# Patient Record
Sex: Female | Born: 1970 | Race: White | Hispanic: No | Smoking: Never smoker
Health system: Southern US, Community
[De-identification: ages and names within clinical notes are randomized; demographics above are authoritative.]

## PROBLEM LIST (undated history)

## (undated) DIAGNOSIS — D6851 Activated protein C resistance: Secondary | ICD-10-CM

## (undated) DIAGNOSIS — N63 Unspecified lump in unspecified breast: Secondary | ICD-10-CM

## (undated) DIAGNOSIS — E079 Disorder of thyroid, unspecified: Secondary | ICD-10-CM

## (undated) HISTORY — DX: Activated protein C resistance: D68.51

## (undated) HISTORY — PX: OTHER SURGICAL HISTORY: SHX169

## (undated) HISTORY — PX: BREAST BIOPSY: SHX20

## (undated) HISTORY — PX: BREAST EXCISIONAL BIOPSY: SUR124

## (undated) HISTORY — PX: TUBAL LIGATION: SHX77

## (undated) HISTORY — DX: Unspecified lump in unspecified breast: N63.0

---

## 1997-09-02 ENCOUNTER — Inpatient Hospital Stay (HOSPITAL_COMMUNITY): Admission: AD | Admit: 1997-09-02 | Discharge: 1997-09-02 | Payer: Self-pay | Admitting: Obstetrics & Gynecology

## 1998-01-09 ENCOUNTER — Inpatient Hospital Stay (HOSPITAL_COMMUNITY): Admission: AD | Admit: 1998-01-09 | Discharge: 1998-01-09 | Payer: Self-pay | Admitting: Obstetrics

## 1998-01-19 ENCOUNTER — Other Ambulatory Visit: Admission: RE | Admit: 1998-01-19 | Discharge: 1998-01-19 | Payer: Self-pay | Admitting: Obstetrics

## 1998-01-19 ENCOUNTER — Encounter: Admission: RE | Admit: 1998-01-19 | Discharge: 1998-01-19 | Payer: Self-pay | Admitting: Obstetrics

## 1998-03-29 ENCOUNTER — Ambulatory Visit (HOSPITAL_COMMUNITY): Admission: RE | Admit: 1998-03-29 | Discharge: 1998-03-29 | Payer: Self-pay | Admitting: *Deleted

## 1998-03-31 ENCOUNTER — Inpatient Hospital Stay (HOSPITAL_COMMUNITY): Admission: AD | Admit: 1998-03-31 | Discharge: 1998-03-31 | Payer: Self-pay | Admitting: Obstetrics and Gynecology

## 1998-04-07 ENCOUNTER — Ambulatory Visit (HOSPITAL_COMMUNITY): Admission: RE | Admit: 1998-04-07 | Discharge: 1998-04-07 | Payer: Self-pay | Admitting: *Deleted

## 1998-04-10 ENCOUNTER — Ambulatory Visit (HOSPITAL_COMMUNITY): Admission: RE | Admit: 1998-04-10 | Discharge: 1998-04-10 | Payer: Self-pay | Admitting: Obstetrics and Gynecology

## 1998-04-13 ENCOUNTER — Ambulatory Visit (HOSPITAL_COMMUNITY): Admission: RE | Admit: 1998-04-13 | Discharge: 1998-04-13 | Payer: Self-pay | Admitting: *Deleted

## 1998-04-17 ENCOUNTER — Ambulatory Visit (HOSPITAL_COMMUNITY): Admission: RE | Admit: 1998-04-17 | Discharge: 1998-04-17 | Payer: Self-pay | Admitting: Obstetrics and Gynecology

## 1998-04-20 ENCOUNTER — Ambulatory Visit (HOSPITAL_COMMUNITY): Admission: RE | Admit: 1998-04-20 | Discharge: 1998-04-20 | Payer: Self-pay | Admitting: Obstetrics & Gynecology

## 1998-04-27 ENCOUNTER — Ambulatory Visit (HOSPITAL_COMMUNITY): Admission: RE | Admit: 1998-04-27 | Discharge: 1998-04-27 | Payer: Self-pay | Admitting: Obstetrics and Gynecology

## 1998-05-22 ENCOUNTER — Ambulatory Visit (HOSPITAL_COMMUNITY): Admission: RE | Admit: 1998-05-22 | Discharge: 1998-05-22 | Payer: Self-pay | Admitting: *Deleted

## 1999-01-15 ENCOUNTER — Other Ambulatory Visit: Admission: RE | Admit: 1999-01-15 | Discharge: 1999-01-15 | Payer: Self-pay | Admitting: *Deleted

## 1999-12-20 ENCOUNTER — Other Ambulatory Visit: Admission: RE | Admit: 1999-12-20 | Discharge: 1999-12-20 | Payer: Self-pay | Admitting: Obstetrics & Gynecology

## 2000-01-04 ENCOUNTER — Ambulatory Visit: Admission: AD | Admit: 2000-01-04 | Discharge: 2000-01-04 | Payer: Self-pay | Admitting: Obstetrics and Gynecology

## 2001-02-02 ENCOUNTER — Other Ambulatory Visit: Admission: RE | Admit: 2001-02-02 | Discharge: 2001-02-02 | Payer: Self-pay | Admitting: Obstetrics and Gynecology

## 2001-07-27 ENCOUNTER — Inpatient Hospital Stay (HOSPITAL_COMMUNITY): Admission: AD | Admit: 2001-07-27 | Discharge: 2001-07-27 | Payer: Self-pay | Admitting: Obstetrics and Gynecology

## 2001-09-03 ENCOUNTER — Encounter: Payer: Self-pay | Admitting: Obstetrics and Gynecology

## 2001-09-03 ENCOUNTER — Encounter: Admission: RE | Admit: 2001-09-03 | Discharge: 2001-09-03 | Payer: Self-pay | Admitting: Obstetrics and Gynecology

## 2001-09-03 ENCOUNTER — Encounter: Admission: RE | Admit: 2001-09-03 | Discharge: 2001-09-03 | Payer: Self-pay | Admitting: Interventional Radiology

## 2001-10-06 ENCOUNTER — Inpatient Hospital Stay (HOSPITAL_COMMUNITY): Admission: AD | Admit: 2001-10-06 | Discharge: 2001-10-06 | Payer: Self-pay | Admitting: Obstetrics and Gynecology

## 2001-10-09 ENCOUNTER — Inpatient Hospital Stay (HOSPITAL_COMMUNITY): Admission: AD | Admit: 2001-10-09 | Discharge: 2001-10-09 | Payer: Self-pay | Admitting: Obstetrics and Gynecology

## 2001-10-10 ENCOUNTER — Encounter: Payer: Self-pay | Admitting: Obstetrics and Gynecology

## 2001-10-10 ENCOUNTER — Inpatient Hospital Stay (HOSPITAL_COMMUNITY): Admission: AD | Admit: 2001-10-10 | Discharge: 2001-10-24 | Payer: Self-pay | Admitting: Obstetrics and Gynecology

## 2001-10-12 ENCOUNTER — Encounter: Payer: Self-pay | Admitting: Obstetrics and Gynecology

## 2001-10-14 ENCOUNTER — Encounter: Payer: Self-pay | Admitting: Obstetrics and Gynecology

## 2001-10-25 ENCOUNTER — Encounter: Admission: RE | Admit: 2001-10-25 | Discharge: 2001-11-24 | Payer: Self-pay | Admitting: Obstetrics and Gynecology

## 2001-12-25 ENCOUNTER — Encounter: Admission: RE | Admit: 2001-12-25 | Discharge: 2002-01-24 | Payer: Self-pay | Admitting: Obstetrics and Gynecology

## 2004-07-26 ENCOUNTER — Ambulatory Visit: Payer: Self-pay

## 2005-12-27 ENCOUNTER — Emergency Department: Payer: Self-pay | Admitting: Emergency Medicine

## 2007-07-28 ENCOUNTER — Emergency Department: Payer: Self-pay | Admitting: Internal Medicine

## 2008-08-10 ENCOUNTER — Emergency Department: Payer: Self-pay | Admitting: Emergency Medicine

## 2009-03-17 ENCOUNTER — Ambulatory Visit: Payer: Self-pay | Admitting: General Surgery

## 2009-03-24 ENCOUNTER — Ambulatory Visit: Payer: Self-pay | Admitting: General Surgery

## 2009-06-02 ENCOUNTER — Ambulatory Visit: Payer: Self-pay | Admitting: General Surgery

## 2010-03-27 ENCOUNTER — Ambulatory Visit: Payer: Self-pay | Admitting: General Surgery

## 2010-04-20 ENCOUNTER — Ambulatory Visit: Payer: Self-pay | Admitting: General Surgery

## 2010-04-23 LAB — PATHOLOGY REPORT

## 2010-04-24 LAB — PATHOLOGY REPORT

## 2010-10-20 ENCOUNTER — Ambulatory Visit: Payer: Self-pay | Admitting: Family Medicine

## 2012-06-29 DIAGNOSIS — IMO0002 Reserved for concepts with insufficient information to code with codable children: Secondary | ICD-10-CM | POA: Insufficient documentation

## 2012-07-02 DIAGNOSIS — M51379 Other intervertebral disc degeneration, lumbosacral region without mention of lumbar back pain or lower extremity pain: Secondary | ICD-10-CM | POA: Insufficient documentation

## 2012-07-02 DIAGNOSIS — M5137 Other intervertebral disc degeneration, lumbosacral region: Secondary | ICD-10-CM | POA: Insufficient documentation

## 2012-12-30 DIAGNOSIS — K219 Gastro-esophageal reflux disease without esophagitis: Secondary | ICD-10-CM | POA: Insufficient documentation

## 2013-01-24 ENCOUNTER — Ambulatory Visit: Payer: Self-pay | Admitting: Family Medicine

## 2013-01-24 LAB — CBC WITH DIFFERENTIAL/PLATELET
Basophil #: 0 10*3/uL (ref 0.0–0.1)
Basophil %: 0.4 %
Eosinophil #: 0.1 10*3/uL (ref 0.0–0.7)
Eosinophil %: 1.9 %
HCT: 38.3 % (ref 35.0–47.0)
HGB: 13.3 g/dL (ref 12.0–16.0)
Lymphocyte #: 2.1 10*3/uL (ref 1.0–3.6)
Lymphocyte %: 28.7 %
MCH: 32 pg (ref 26.0–34.0)
MCHC: 34.7 g/dL (ref 32.0–36.0)
MCV: 92 fL (ref 80–100)
Monocyte #: 0.4 x10 3/mm (ref 0.2–0.9)
Monocyte %: 6.2 %
Neutrophil #: 4.5 10*3/uL (ref 1.4–6.5)
Neutrophil %: 62.8 %
Platelet: 226 10*3/uL (ref 150–440)
RBC: 4.15 10*6/uL (ref 3.80–5.20)
RDW: 13.8 % (ref 11.5–14.5)
WBC: 7.2 10*3/uL (ref 3.6–11.0)

## 2013-01-24 LAB — SEDIMENTATION RATE: Erythrocyte Sed Rate: 13 mm/hr (ref 0–20)

## 2013-08-12 DIAGNOSIS — M549 Dorsalgia, unspecified: Secondary | ICD-10-CM

## 2013-08-12 DIAGNOSIS — G8929 Other chronic pain: Secondary | ICD-10-CM | POA: Insufficient documentation

## 2013-11-03 ENCOUNTER — Encounter: Payer: Self-pay | Admitting: *Deleted

## 2013-11-15 ENCOUNTER — Ambulatory Visit: Payer: Self-pay | Admitting: General Surgery

## 2013-12-01 ENCOUNTER — Encounter: Payer: Self-pay | Admitting: *Deleted

## 2014-07-10 ENCOUNTER — Emergency Department: Payer: Self-pay | Admitting: Emergency Medicine

## 2014-07-10 LAB — COMPREHENSIVE METABOLIC PANEL
ALBUMIN: 4 g/dL (ref 3.4–5.0)
ALT: 29 U/L
Alkaline Phosphatase: 99 U/L
Anion Gap: 8 (ref 7–16)
BILIRUBIN TOTAL: 0.4 mg/dL (ref 0.2–1.0)
BUN: 8 mg/dL (ref 7–18)
CALCIUM: 9.2 mg/dL (ref 8.5–10.1)
CHLORIDE: 101 mmol/L (ref 98–107)
CO2: 30 mmol/L (ref 21–32)
Creatinine: 0.85 mg/dL (ref 0.60–1.30)
Glucose: 98 mg/dL (ref 65–99)
OSMOLALITY: 276 (ref 275–301)
Potassium: 3.2 mmol/L — ABNORMAL LOW (ref 3.5–5.1)
SGOT(AST): 19 U/L (ref 15–37)
SODIUM: 139 mmol/L (ref 136–145)
TOTAL PROTEIN: 7.6 g/dL (ref 6.4–8.2)

## 2014-07-10 LAB — CBC
HCT: 42.6 % (ref 35.0–47.0)
HGB: 14.5 g/dL (ref 12.0–16.0)
MCH: 32 pg (ref 26.0–34.0)
MCHC: 34.1 g/dL (ref 32.0–36.0)
MCV: 94 fL (ref 80–100)
Platelet: 320 10*3/uL (ref 150–440)
RBC: 4.53 10*6/uL (ref 3.80–5.20)
RDW: 13.8 % (ref 11.5–14.5)
WBC: 13.4 10*3/uL — ABNORMAL HIGH (ref 3.6–11.0)

## 2014-07-10 LAB — LIPASE, BLOOD: LIPASE: 117 U/L (ref 73–393)

## 2014-07-11 ENCOUNTER — Encounter: Payer: Self-pay | Admitting: General Surgery

## 2014-07-11 LAB — URINALYSIS, COMPLETE
Bilirubin,UR: NEGATIVE
Blood: NEGATIVE
Glucose,UR: NEGATIVE mg/dL (ref 0–75)
KETONE: NEGATIVE
Leukocyte Esterase: NEGATIVE
NITRITE: NEGATIVE
Ph: 7 (ref 4.5–8.0)
Protein: NEGATIVE
Specific Gravity: 1.005 (ref 1.003–1.030)
WBC UR: <1 /HPF (ref 0–5)

## 2014-07-27 ENCOUNTER — Ambulatory Visit: Payer: Self-pay | Admitting: General Surgery

## 2014-08-16 ENCOUNTER — Encounter: Payer: Self-pay | Admitting: *Deleted

## 2014-11-09 DIAGNOSIS — I10 Essential (primary) hypertension: Secondary | ICD-10-CM | POA: Insufficient documentation

## 2015-03-16 LAB — HM PAP SMEAR: HM Pap smear: NORMAL

## 2015-03-23 ENCOUNTER — Other Ambulatory Visit: Payer: Self-pay | Admitting: Unknown Physician Specialty

## 2015-03-23 DIAGNOSIS — N6459 Other signs and symptoms in breast: Secondary | ICD-10-CM

## 2015-03-29 ENCOUNTER — Ambulatory Visit
Admission: RE | Admit: 2015-03-29 | Discharge: 2015-03-29 | Disposition: A | Discharge status: Home / Self Care | Payer: 59 | Source: Ambulatory Visit | Attending: Unknown Physician Specialty | Admitting: Unknown Physician Specialty

## 2015-03-29 ENCOUNTER — Other Ambulatory Visit: Payer: Self-pay | Admitting: Unknown Physician Specialty

## 2015-03-29 DIAGNOSIS — R921 Mammographic calcification found on diagnostic imaging of breast: Secondary | ICD-10-CM | POA: Diagnosis present

## 2015-03-29 DIAGNOSIS — N6001 Solitary cyst of right breast: Secondary | ICD-10-CM | POA: Insufficient documentation

## 2015-03-29 DIAGNOSIS — N6459 Other signs and symptoms in breast: Secondary | ICD-10-CM

## 2015-04-18 DIAGNOSIS — E039 Hypothyroidism, unspecified: Secondary | ICD-10-CM | POA: Insufficient documentation

## 2015-12-14 HISTORY — PX: BACK SURGERY: SHX140

## 2016-11-28 ENCOUNTER — Ambulatory Visit (INDEPENDENT_AMBULATORY_CARE_PROVIDER_SITE_OTHER): Payer: BLUE CROSS/BLUE SHIELD | Admitting: Obstetrics and Gynecology

## 2016-11-28 ENCOUNTER — Encounter: Payer: Self-pay | Admitting: Obstetrics and Gynecology

## 2016-11-28 VITALS — BP 132/78 | HR 98 | Ht 67.0 in | Wt 251.0 lb

## 2016-11-28 DIAGNOSIS — N644 Mastodynia: Secondary | ICD-10-CM | POA: Diagnosis not present

## 2016-11-28 DIAGNOSIS — Z1239 Encounter for other screening for malignant neoplasm of breast: Secondary | ICD-10-CM

## 2016-11-28 DIAGNOSIS — Z1231 Encounter for screening mammogram for malignant neoplasm of breast: Secondary | ICD-10-CM

## 2016-11-28 DIAGNOSIS — N6001 Solitary cyst of right breast: Secondary | ICD-10-CM

## 2016-11-28 NOTE — Progress Notes (Signed)
Obstetrics & Gynecology Office Visit   Chief Complaint:  Chief Complaint  Patient presents with  . Breast Pain    severe right breast pain x 1 week    History of Present Illness: 46 year old with a history of bright breast cyst with normal diagnostic mammogram and ultrasound 03/30/15.  Noted worsening right sided breast pain unrelated to menstrual cycle. No recent trauma, excessive exercise to suggest MSK origin.  Has not noted any overlying skin changes.   Review of Systems: Review of Systems  Constitutional: Negative for chills, fever and weight loss.  Skin: Negative for rash.  Psychiatric/Behavioral: The patient is not nervous/anxious.     Past Medical History:  Past Medical History:  Diagnosis Date  . Breast lump     Past Surgical History:  Past Surgical History:  Procedure Laterality Date  . BACK SURGERY  12/2015   [facility]  . BREAST BIOPSY Right    negative  . BREAST EXCISIONAL BIOPSY Right    negative 2011  . CESAREAN SECTION  2003  . fatty lipoma    . TUBAL LIGATION      Gynecologic History: Patient's last menstrual period was 11/20/2016 (exact date).  Obstetric History: [id number]  Family History:  Family History  Problem Relation Age of Onset  . Breast cancer Neg Hx     Social History:  Social History   Social History  . Marital status: Divorced    Spouse name: N/A  . Number of children: N/A  . Years of education: N/A   Occupational History  . Not on file.   Social History Main Topics  . Smoking status: Never Smoker  . Smokeless tobacco: Never Used  . Alcohol use Yes  . Drug use: No  . Sexual activity: Yes    Birth control/ protection: Surgical   Other Topics Concern  . Not on file   Social History Narrative  . No narrative on file    Allergies:  Allergies  Allergen Reactions  . Sulfa Antibiotics Other (See Comments)    Medications: Prior to Admission medications   Medication Sig Start Date End Date Taking? Authorizing  Provider  acyclovir (ZOVIRAX) 200 MG capsule Take by mouth. 09/26/11  Yes [provider]  ALPRAZolam (NIRAVAM) 0.5 MG dissolvable tablet Take 0.5 mg by mouth. 01/05/14  Yes [provider]  esomeprazole (NEXIUM) 40 MG capsule Take 40 mg by mouth. 08/07/16  Yes [provider]  gabapentin (NEURONTIN) 300 MG capsule Take 300 mg by mouth. 07/24/16  Yes [provider]  levothyroxine (SYNTHROID, LEVOTHROID) 25 MCG tablet TAKE 1 TABLET ON AN EMPTY STOMACH WITH A GLASS OF WATER AT LEAST 30 TO 60 MINUTES BEFORE BREAKFAST. 02/16/16  Yes [provider]  sertraline (ZOLOFT) 100 MG tablet TAKE (1) TABLET BY MOUTH EVERY DAY 03/20/16  Yes [provider]  traMADol (ULTRAM) 50 MG tablet Take 50 mg by mouth. 07/24/16  Yes [provider]  cetirizine (ZYRTEC) 10 MG tablet Take 10 mg by mouth.    [provider]  cyclobenzaprine (FLEXERIL) 5 MG tablet  11/04/16   [provider]  hydrochlorothiazide (MICROZIDE) 12.5 MG capsule  09/27/16   [provider]  ibuprofen (ADVIL,MOTRIN) 800 MG tablet  11/04/16   [provider]    Physical Exam Vitals:  Vitals:   11/28/16 1504  BP: 132/78  Pulse: 98   Patient's last menstrual period was 11/20/2016 (exact date).  General: NAD HEENT: normocephalic, anicteric Breast: Symetrical, no  skin retraction, everted nipples, no discharge, no masses, reports tenderness on palpation of right breast upper outer quadrant.  No axillary or supraclavicular lymphadenopathy Pulmonary: No increased work of breathing Extremities: no edema, erythema, or tenderness Neurologic: Grossly intact Psychiatric: mood appropriate, affect full  Female chaperone present for pelvic and breast  portions of the physical exam  Assessment: 46 y.o. [id number] with mastalgia  Plan: Problem List Items Addressed This Visit    None    Visit Diagnoses    Pain of right breast    -  Primary   Relevant Orders   US  BREAST LTD UNI RIGHT INC AXILLA   Benign breast cyst in female, right       Relevant Orders   MM Digital Diagnostic Bilat   US BREAST LTD UNI RIGHT INC AXILLA   Breast screening       Relevant Orders   MM Digital Diagnostic Bilat     - diagnostic mammogram and ultrasound follow up right breast cyst - mammogram of left breast as well given due for screening - RTC in 8 weeks to re-evalute, annual, prior breast surgery by Jamal Collin

## 2016-11-28 NOTE — Patient Instructions (Signed)
Breast Cyst A breast cyst is a sac in the breast that is filled with fluid. Breast cysts are usually noncancerous (benign). They are common among women, and they are most often located in the upper, outer portion of the breast. One or more cysts may develop. They form when fluid builds up inside of the breast glands. There are several types of breast cysts:  Macrocyst. This is a cyst that is about 2 inches (5.1 cm) across (in diameter).  Microcyst. This is a very small cyst that you cannot feel, but it can be seen with imaging tests such as an X-ray of the breast (mammogram) or ultrasound.  Galactocele. This is a cyst that contains milk. It may develop if you suddenly stop breastfeeding.  Breast cysts do not increase your risk of breast cancer. They usually disappear after menopause, unless you take artificial hormones (are on hormone therapy). What are the causes? The exact cause of breast cysts is not known. Possible causes include:  Blockage of tubes (ducts) in the breast glands, which leads to fluid buildup. Duct blockage may result from: ? Fibrocystic breast changes. This is a common, benign condition that occurs when women go through hormonal changes during the menstrual cycle. This is a common cause of multiple breast cysts. ? Overgrowth of breast tissue or breast glands. ? Scar tissue in the breast from previous surgery.  Changes in certain female hormones (estrogen and progesterone).  What increases the risk? You may be more likely to develop breast cysts if you have not gone through menopause. What are the signs or symptoms? Symptoms of a breast cyst may include:  Feeling one or more smooth, round, soft lumps (like grapes) in the breast that are easily moveable. The lump(s) may get bigger and more painful before your period and get smaller after your period.  Breast discomfort or pain.  How is this diagnosed? A cyst can be felt during a physical exam by your health care  provider. A mammogram and ultrasound will be done to confirm the diagnosis. Fluid may be removed from the cyst with a needle (fine-needle aspiration) and tested to make sure the cyst is not cancerous. How is this treated? Treatment may not be necessary. Your health care provider may monitor the cyst to see if it goes away on its own. If the cyst is uncomfortable or gets bigger, or if you do not like how the cyst makes your breast look, you may need treatment. Treatment may include:  Hormone treatment.  Fine-needle aspiration, to drain fluid from the cyst. There is a chance of the cyst coming back (recurring) after aspiration.  Surgery to remove the cyst.  Follow these instructions at home:  See your health care provider regularly. ? Get a yearly physical exam. ? If you are 20-40 years old, get a clinical breast exam every 1-3 years. After age 40, get this exam every year. ? Get mammograms as often as directed.  Do a breast self-exam every month, or as often as directed. Having many breast cysts, or "lumpy" breasts, may make it harder to feel for new lumps. Understand how your breasts normally look and feel, and write down any changes in your breasts so you can tell your health care provider about the changes. A breast self-exam involves: ? Comparing your breasts in the mirror. ? Looking for visible changes in your skin or nipples. ? Feeling for lumps or changes.  Take over-the-counter and prescription medicines only as told by your health care   provider.  Wear a supportive bra, especially when exercising.  Follow instructions from your health care provider about eating and drinking restrictions. ? Avoid caffeine. ? Cut down on salt (sodium) in what you eat and drink, especially before your menstrual period. Too much sodium can cause fluid buildup (retention), breast swelling, and discomfort.  Keep all follow-up visits as told your health care provider. This is important. Contact a  health care provider if:  You feel, or think you feel, a lump in your breast.  You notice that both breasts look or feel different than usual.  Your breast is still causing pain after your menstrual period is over.  You find new lumps or bumps that were not there before.  You feel lumps in your armpit (axilla). Get help right away if:  You have severe pain, tenderness, redness, or warmth in your breast.  You have fluid or blood leaking from your nipple.  Your breast lump becomes hard and painful.  You notice dimpling or wrinkling of the breast or nipple. This information is not intended to replace advice given to you by your health care provider. Make sure you discuss any questions you have with your health care provider. Document Released: 07/01/2005 Document Revised: 03/22/2016 Document Reviewed: 03/22/2016 Elsevier Interactive Patient Education  2017 [employer]  

## 2016-11-29 NOTE — Addendum Note (Signed)
Addended by: Dorthula Nettles on: 11/29/2016 09:02 AM   Modules accepted: Orders

## 2016-11-29 NOTE — Addendum Note (Signed)
Addended by: Dorthula Nettles on: 11/29/2016 08:38 AM   Modules accepted: Orders

## 2016-12-06 ENCOUNTER — Ambulatory Visit: Payer: Self-pay

## 2016-12-06 ENCOUNTER — Other Ambulatory Visit: Payer: Self-pay

## 2016-12-10 ENCOUNTER — Other Ambulatory Visit: Payer: Self-pay

## 2016-12-10 ENCOUNTER — Ambulatory Visit: Payer: Self-pay

## 2017-09-26 DIAGNOSIS — R7303 Prediabetes: Secondary | ICD-10-CM | POA: Insufficient documentation

## 2018-05-04 DIAGNOSIS — R928 Other abnormal and inconclusive findings on diagnostic imaging of breast: Secondary | ICD-10-CM | POA: Insufficient documentation

## 2018-05-04 DIAGNOSIS — IMO0002 Reserved for concepts with insufficient information to code with codable children: Secondary | ICD-10-CM | POA: Insufficient documentation

## 2018-05-04 DIAGNOSIS — B009 Herpesviral infection, unspecified: Secondary | ICD-10-CM | POA: Insufficient documentation

## 2018-05-04 DIAGNOSIS — Z832 Family history of diseases of the blood and blood-forming organs and certain disorders involving the immune mechanism: Secondary | ICD-10-CM | POA: Insufficient documentation

## 2018-05-04 DIAGNOSIS — F419 Anxiety disorder, unspecified: Secondary | ICD-10-CM

## 2018-05-04 DIAGNOSIS — D249 Benign neoplasm of unspecified breast: Secondary | ICD-10-CM | POA: Insufficient documentation

## 2018-05-04 DIAGNOSIS — F329 Major depressive disorder, single episode, unspecified: Secondary | ICD-10-CM | POA: Insufficient documentation

## 2018-07-14 ENCOUNTER — Ambulatory Visit (INDEPENDENT_AMBULATORY_CARE_PROVIDER_SITE_OTHER): Payer: BLUE CROSS/BLUE SHIELD | Admitting: Maternal Newborn

## 2018-07-14 ENCOUNTER — Other Ambulatory Visit (HOSPITAL_COMMUNITY)
Admission: RE | Admit: 2018-07-14 | Discharge: 2018-07-14 | Disposition: A | Discharge status: Home / Self Care | Payer: BLUE CROSS/BLUE SHIELD | Source: Ambulatory Visit | Attending: Maternal Newborn | Admitting: Maternal Newborn

## 2018-07-14 ENCOUNTER — Encounter: Payer: Self-pay | Admitting: Maternal Newborn

## 2018-07-14 VITALS — BP 150/90 | Ht 67.0 in | Wt 256.0 lb

## 2018-07-14 DIAGNOSIS — B379 Candidiasis, unspecified: Secondary | ICD-10-CM

## 2018-07-14 DIAGNOSIS — N760 Acute vaginitis: Secondary | ICD-10-CM | POA: Insufficient documentation

## 2018-07-14 LAB — POCT WET PREP (WET MOUNT): Trichomonas Wet Prep HPF POC: ABSENT

## 2018-07-14 MED ORDER — FLUCONAZOLE 150 MG PO TABS: 150.0000 mg | ORAL_TABLET | Freq: Every day | ORAL | 0 refills | Status: AC

## 2018-07-14 NOTE — Progress Notes (Signed)
Obstetrics & Gynecology Office Visit   Chief Complaint:  Chief Complaint  Patient presents with  . Vaginitis   History of Present Illness: Karen Dudley has been having vaginal irritation and itching with discharge and was treated a couple of weeks ago for a yeast infection by her primary care provider. After taking Diflucan, her symptoms went away for a while, but they have now returned. Karen Dudley has had recurrent yeast infections in the past. She is not currently sexually active. She is wearing cotton scrubs to help prevent symptoms. She has tried taking a probiotic supplement in the past, but reported that this actually made her symptoms worse and she has stopped using it.   Review of Systems  Constitutional: Negative.   HENT: Positive for congestion.   Eyes: Negative.   Respiratory: Negative.   Cardiovascular: Negative.   Gastrointestinal: Negative.   Genitourinary: Positive for frequency.       Vaginal irritation, itching, and discharge  Musculoskeletal: Positive for joint pain.  Skin: Negative.   Neurological: Negative.   Endo/Heme/Allergies: Bruises/bleeds easily.       Hot flashes  Psychiatric/Behavioral: Positive for depression. The patient is nervous/anxious.   All other systems reviewed and are negative.   Past Medical History:  Past Medical History:  Diagnosis Date  . Breast lump     Past Surgical History:  Past Surgical History:  Procedure Laterality Date  . BACK SURGERY  12/2015   [facility]  . BREAST BIOPSY Right    negative  . BREAST EXCISIONAL BIOPSY Right    negative 2011  . CESAREAN SECTION  2003  . fatty lipoma    . TUBAL LIGATION      Gynecologic History: Patient's last menstrual period was 07/08/2018.  Obstetric History: [id number]  Family History:  Family History  Problem Relation Age of Onset  . Breast cancer Neg Hx     Social History:  Social History   Socioeconomic History  . Marital status: Divorced    Spouse name: Not on file  .  Number of children: Not on file  . Years of education: Not on file  . Highest education level: Not on file  Occupational History  . Not on file  Social Needs  . Financial resource strain: Not on file  . Food insecurity:    Worry: Not on file    Inability: Not on file  . Transportation needs:    Medical: Not on file    Non-medical: Not on file  Tobacco Use  . Smoking status: Never Smoker  . Smokeless tobacco: Never Used  Substance and Sexual Activity  . Alcohol use: Yes  . Drug use: No  . Sexual activity: Yes    Birth control/protection: Surgical  Lifestyle  . Physical activity:    Days per week: Not on file    Minutes per session: Not on file  . Stress: Not on file  Relationships  . Social connections:    Talks on phone: Not on file    Gets together: Not on file    Attends religious service: Not on file    Active member of club or organization: Not on file    Attends meetings of clubs or organizations: Not on file    Relationship status: Not on file  . Intimate partner violence:    Fear of current or ex partner: Not on file    Emotionally abused: Not on file    Physically abused: Not on file    Forced sexual activity:  Not on file  Other Topics Concern  . Not on file  Social History Narrative  . Not on file    Allergies:  Allergies  Allergen Reactions  . Sulfa Antibiotics Other (See Comments)    Medications: Prior to Admission medications   Medication Sig Start Date End Date Taking? Authorizing Provider  acyclovir (ZOVIRAX) 200 MG capsule Take by mouth. 09/26/11  Yes [provider]  ALPRAZolam (NIRAVAM) 0.5 MG dissolvable tablet Take 0.5 mg by mouth. 01/05/14  Yes [provider]  cetirizine (ZYRTEC) 10 MG tablet Take 10 mg by mouth.   Yes [provider]  cyclobenzaprine (FLEXERIL) 5 MG tablet  11/04/16  Yes [provider]  esomeprazole (NEXIUM) 40 MG capsule Take 40 mg by mouth. 08/07/16  Yes [provider]    gabapentin (NEURONTIN) 300 MG capsule Take 300 mg by mouth. 07/24/16  Yes [provider]  hydrochlorothiazide (MICROZIDE) 12.5 MG capsule  09/27/16  Yes [provider]  ibuprofen (ADVIL,MOTRIN) 800 MG tablet  11/04/16  Yes [provider]  levothyroxine (SYNTHROID, LEVOTHROID) 25 MCG tablet TAKE 1 TABLET ON AN EMPTY STOMACH WITH A GLASS OF WATER AT LEAST 30 TO 60 MINUTES BEFORE BREAKFAST. 02/16/16  Yes [provider]  sertraline (ZOLOFT) 100 MG tablet TAKE (1) TABLET BY MOUTH EVERY DAY 03/20/16  Yes [provider]  traMADol (ULTRAM) 50 MG tablet Take 50 mg by mouth. 07/24/16  Yes [provider]    Physical Exam Vitals:  Vitals:   07/14/18 0930  BP: (!) 150/90   Patient's last menstrual period was 07/08/2018.  General: NAD Pulmonary: No increased work of breathing Genitourinary:  External: mild erythema, otherwise normal external female  genitalia.  Normal urethral meatus, normal  Bartholin's and  Skene's glands.    Vagina: Normal vaginal mucosa, no evidence of prolapse.   Small amount of thick yellow discharge. Neurologic: Grossly intact Psychiatric: mood appropriate, affect full  Assessment: 47 y.o. [id number] with recurrent vaginitis with yeast on wet prep.  Plan: Problem List Items Addressed This Visit    None    Visit Diagnoses    Acute vaginitis    -  Primary   Relevant Orders   Cervicovaginal ancillary only     Yeast seen on wet prep. Swab sent to rule out concurrent BV due to recurrent nature of symptoms. Rx sent for Diflucan.  Avel Sensor, CNM 07/14/2018  9:54 AM

## 2018-07-16 LAB — CERVICOVAGINAL ANCILLARY ONLY: Bacterial vaginitis: NEGATIVE

## 2018-08-10 ENCOUNTER — Encounter: Payer: Self-pay | Admitting: Obstetrics and Gynecology

## 2018-08-10 ENCOUNTER — Ambulatory Visit (INDEPENDENT_AMBULATORY_CARE_PROVIDER_SITE_OTHER): Payer: BLUE CROSS/BLUE SHIELD | Admitting: Obstetrics and Gynecology

## 2018-08-10 ENCOUNTER — Other Ambulatory Visit (HOSPITAL_COMMUNITY)
Admission: RE | Admit: 2018-08-10 | Discharge: 2018-08-10 | Disposition: A | Discharge status: Home / Self Care | Payer: BLUE CROSS/BLUE SHIELD | Source: Ambulatory Visit | Attending: Obstetrics and Gynecology | Admitting: Obstetrics and Gynecology

## 2018-08-10 VITALS — BP 142/84 | Ht 67.0 in | Wt 262.0 lb

## 2018-08-10 DIAGNOSIS — A599 Trichomoniasis, unspecified: Secondary | ICD-10-CM | POA: Diagnosis not present

## 2018-08-10 DIAGNOSIS — Z01419 Encounter for gynecological examination (general) (routine) without abnormal findings: Secondary | ICD-10-CM | POA: Insufficient documentation

## 2018-08-10 DIAGNOSIS — Z124 Encounter for screening for malignant neoplasm of cervix: Secondary | ICD-10-CM | POA: Insufficient documentation

## 2018-08-10 DIAGNOSIS — Z113 Encounter for screening for infections with a predominantly sexual mode of transmission: Secondary | ICD-10-CM

## 2018-08-10 DIAGNOSIS — Z1339 Encounter for screening examination for other mental health and behavioral disorders: Secondary | ICD-10-CM

## 2018-08-10 DIAGNOSIS — Z1331 Encounter for screening for depression: Secondary | ICD-10-CM

## 2018-08-10 MED ORDER — METRONIDAZOLE 500 MG PO TABS: 2000.0000 mg | ORAL_TABLET | Freq: Once | ORAL | 0 refills | Status: AC

## 2018-08-10 NOTE — Progress Notes (Signed)
Gynecology Annual Exam  PCP: Patient, No Pcp Per  Chief Complaint  Patient presents with  . Annual Exam   History of Present Illness:  Ms. Karen Dudley is a 48 y.o. [id number] who LMP was Patient's last menstrual period was 08/02/2018., presents today for her annual examination.  Her menses are irregular. She will skip months sometimes.  Her menses last about 1-2 days and are a lot lighter. This has been going through this for about 2 months now.    She does have vasomotor symptoms. She has hot flashes, some weight gain.    She is not sexually active. She has a new boyfriend but is not sexually active .  Last Pap: 11/2016  Results were: no abnormalities /neg HPV DNA negative.  Hx of STDs: HSV  Last mammogram: 12/2016  Results were: BiRads 2 There is no FH of breast cancer. There is no FH of ovarian cancer. The patient does do self-breast exams.  Colonoscopy: has had with Dr. Jamal Collin.  She states that she is overdue.  DEXA: has not been screened for osteoporosis  Tobacco use: The patient denies current or previous tobacco use. Alcohol use: social drinker Exercise: not active. Has had back surgery and is trying to get back into it.  The patient wears seatbelts: yes.     She has had ongoing vaginal symptoms.  She has a discharge, itching, irritation.  She describes the discharge as white.  There is no odor to the discharge.  She has been treated for yeast and BV.     Past Medical History:  Diagnosis Date  . Breast lump   . Factor V Leiden carrier [facility])     Past Surgical History:  Procedure Laterality Date  . BACK SURGERY  12/2015   [facility]  . BREAST BIOPSY Right    negative  . BREAST EXCISIONAL BIOPSY Right    negative 2011  . CESAREAN SECTION  2003  . fatty lipoma    . TUBAL LIGATION      Prior to Admission medications   Medication Sig Start Date End Date Taking? Authorizing Provider  acyclovir (ZOVIRAX) 200 MG capsule Take by mouth. 09/26/11  Yes [provider]   ALPRAZolam (NIRAVAM) 0.5 MG dissolvable tablet Take 0.5 mg by mouth. 01/05/14  Yes [provider]  cetirizine (ZYRTEC) 10 MG tablet Take 10 mg by mouth.   Yes [provider]  cyclobenzaprine (FLEXERIL) 5 MG tablet  11/04/16  Yes [provider]  esomeprazole (NEXIUM) 40 MG capsule Take 40 mg by mouth. 08/07/16  Yes [provider]  gabapentin (NEURONTIN) 300 MG capsule Take 300 mg by mouth. 07/24/16  Yes [provider]  hydrochlorothiazide (MICROZIDE) 12.5 MG capsule  09/27/16  Yes [provider]  ibuprofen (ADVIL,MOTRIN) 800 MG tablet  11/04/16  Yes [provider]  levothyroxine (SYNTHROID, LEVOTHROID) 25 MCG tablet TAKE 1 TABLET ON AN EMPTY STOMACH WITH A GLASS OF WATER AT LEAST 30 TO 60 MINUTES BEFORE BREAKFAST. 02/16/16  Yes [provider]  sertraline (ZOLOFT) 100 MG tablet TAKE (1) TABLET BY MOUTH EVERY DAY 03/20/16  Yes [provider]  traMADol (ULTRAM) 50 MG tablet Take 50 mg by mouth. 07/24/16  Yes [provider]    Allergies  Allergen Reactions  . Sulfa Antibiotics Other (See Comments)   Obstetric History: [id number]  Family History  Problem Relation Age of Onset  . Breast cancer Neg Hx     Social History   Socioeconomic History  .  Marital status: Divorced    Spouse name: Not on file  . Number of children: Not on file  . Years of education: Not on file  . Highest education level: Not on file  Occupational History  . Not on file  Social Needs  . Financial resource strain: Not on file  . Food insecurity:    Worry: Not on file    Inability: Not on file  . Transportation needs:    Medical: Not on file    Non-medical: Not on file  Tobacco Use  . Smoking status: Never Smoker  . Smokeless tobacco: Never Used  Substance and Sexual Activity  . Alcohol use: Yes  . Drug use: No  . Sexual activity: Yes    Birth control/protection: Surgical  Lifestyle  . Physical activity:    Days per  week: Not on file    Minutes per session: Not on file  . Stress: Not on file  Relationships  . Social connections:    Talks on phone: Not on file    Gets together: Not on file    Attends religious service: Not on file    Active member of club or organization: Not on file    Attends meetings of clubs or organizations: Not on file    Relationship status: Not on file  . Intimate partner violence:    Fear of current or ex partner: Not on file    Emotionally abused: Not on file    Physically abused: Not on file    Forced sexual activity: Not on file  Other Topics Concern  . Not on file  Social History Narrative  . Not on file    Review of Systems  Constitutional: Negative.   HENT: Negative.   Eyes: Negative.   Respiratory: Negative.   Cardiovascular: Negative.   Gastrointestinal: Negative.   Genitourinary: Negative.   Musculoskeletal: Negative.   Skin: Negative.   Neurological: Negative.   Psychiatric/Behavioral: Negative.      Physical Exam BP (!) 142/84   Ht 5\' 7"  (1.702 m)   Wt 262 lb (118.8 kg)   LMP 08/02/2018   BMI 41.04 kg/m   Physical Exam Constitutional:      General: She is not in acute distress.    Appearance: She is well-developed.  Genitourinary:     Pelvic exam was performed with patient supine.     Uterus normal.     No inguinal adenopathy present in the right or left side.    No signs of injury in the vagina.     No vaginal discharge, erythema, tenderness or bleeding.     Cervical discharge (frothy, white) present.     No cervical motion tenderness, lesion or polyp.     Uterus is mobile.     Uterus is not enlarged or tender.     No uterine mass detected.    Uterus is anteverted.     No right or left adnexal mass present.     Right adnexa not tender or full.     Left adnexa not tender or full.  HENT:     Head: Normocephalic and atraumatic.  Eyes:     General: No scleral icterus. Neck:     Musculoskeletal: Normal range of motion and neck  supple.     Thyroid: No thyromegaly.  Cardiovascular:     Rate and Rhythm: Normal rate and regular rhythm.     Heart sounds: No murmur. No friction rub. No gallop.   Pulmonary:  Effort: Pulmonary effort is normal. No respiratory distress.     Breath sounds: Normal breath sounds. No wheezing or rales.  Chest:     Breasts:        Right: No inverted nipple, mass, nipple discharge, skin change or tenderness.        Left: No inverted nipple, mass, nipple discharge, skin change or tenderness.  Abdominal:     General: Bowel sounds are normal. There is no distension.     Palpations: Abdomen is soft. There is no mass.     Tenderness: There is no abdominal tenderness. There is no guarding or rebound.  Musculoskeletal: Normal range of motion.        General: No tenderness.  Lymphadenopathy:     Cervical: No cervical adenopathy.     Lower Body: No right inguinal adenopathy. No left inguinal adenopathy.  Neurological:     Mental Status: She is alert and oriented to person, place, and time.     Cranial Nerves: No cranial nerve deficit.  Skin:    General: Skin is warm and dry.     Findings: No erythema or rash.  Psychiatric:        Behavior: Behavior normal.        Judgment: Judgment normal.    Female chaperone present for pelvic and breast  portions of the physical exam  Results: AUDIT Questionnaire (screen for alcoholism): 1 PHQ-9: 1  Wet Prep: PH: 5.0 Clue Cells: Negative Fungal elements: Negative Trichomonas: Positive   Assessment: 48 y.o. [id number] female here for routine gynecologic examination.  Plan: Problem List Items Addressed This Visit    None    Visit Diagnoses    Women's annual routine gynecological examination    -  Primary   Relevant Orders   Cytology - PAP   POCT Wet Prep [facility]) (Completed)   Screening for alcoholism       Screening for depression       Pap smear for cervical cancer screening       Relevant Orders   Cytology - PAP   Trichomoniasis        Screen for STD (sexually transmitted disease)       Relevant Orders   Cytology - PAP   POCT Wet Prep [facility]) (Completed)      Screening: -- Blood pressure screen managed by PCP -- Colonoscopy - not due -- Mammogram - due. Patient to call [place] to arrange. She understands that it is her responsibility to arrange this. -- Weight screening: obese: discussed management options, including lifestyle, dietary, and exercise. -- Depression screening negative (PHQ-9) -- Nutrition: normal -- cholesterol screening: per PCP -- osteoporosis screening: not due -- tobacco screening: not using -- alcohol screening: AUDIT questionnaire indicates low-risk usage. -- family history of breast cancer screening: done. not at high risk. -- no evidence of domestic violence or intimate partner violence. -- STD screening: gonorrhea/chlamydia NAAT collected -- pap smear collected per ASCCP guidelines -- flu vaccine received -- HPV vaccination series: not eligilbe   Trichomoniasis: Treat with Flagyl 2 grams x 1 dose.  Will get other STD testing (gonorrhea, chlamydia, trichomonas for verification). Discussed that partner needs to be treated and any of his partners.  Avoid intercourse x 7 days with any new partners.    Prentice Docker, MD 08/11/2018 1:45 PM

## 2018-08-11 ENCOUNTER — Encounter: Payer: Self-pay | Admitting: Obstetrics and Gynecology

## 2018-08-11 LAB — POCT WET PREP (WET MOUNT): PH, VAGINAL: 5

## 2018-08-12 ENCOUNTER — Encounter: Payer: Self-pay | Admitting: Obstetrics and Gynecology

## 2018-08-12 LAB — CYTOLOGY - PAP
ADEQUACY: ABSENT
CHLAMYDIA, DNA PROBE: NEGATIVE
Diagnosis: NEGATIVE
HPV (WINDOPATH): NOT DETECTED
Neisseria Gonorrhea: NEGATIVE
Trichomonas: POSITIVE — AB

## 2019-06-28 ENCOUNTER — Ambulatory Visit (INDEPENDENT_AMBULATORY_CARE_PROVIDER_SITE_OTHER): Payer: BLUE CROSS/BLUE SHIELD

## 2019-06-28 ENCOUNTER — Other Ambulatory Visit: Payer: Self-pay

## 2019-06-28 ENCOUNTER — Encounter: Payer: Self-pay | Admitting: Emergency Medicine

## 2019-06-28 ENCOUNTER — Ambulatory Visit
Admission: EM | Admit: 2019-06-28 | Discharge: 2019-06-28 | Disposition: A | Discharge status: Home / Self Care | Payer: BLUE CROSS/BLUE SHIELD | Attending: Family Medicine | Admitting: Family Medicine

## 2019-06-28 DIAGNOSIS — S90121A Contusion of right lesser toe(s) without damage to nail, initial encounter: Secondary | ICD-10-CM | POA: Diagnosis not present

## 2019-06-28 DIAGNOSIS — L02611 Cutaneous abscess of right foot: Secondary | ICD-10-CM

## 2019-06-28 DIAGNOSIS — L03031 Cellulitis of right toe: Secondary | ICD-10-CM | POA: Diagnosis not present

## 2019-06-28 DIAGNOSIS — M79674 Pain in right toe(s): Secondary | ICD-10-CM | POA: Diagnosis not present

## 2019-06-28 HISTORY — DX: Disorder of thyroid, unspecified: E07.9

## 2019-06-28 MED ORDER — CEPHALEXIN 500 MG PO CAPS: 500.0000 mg | ORAL_CAPSULE | Freq: Three times a day (TID) | ORAL | 0 refills | Status: AC

## 2019-06-28 MED ORDER — FLUCONAZOLE 150 MG PO TABS: 150.0000 mg | ORAL_TABLET | Freq: Every day | ORAL | 0 refills | Status: AC

## 2019-06-28 NOTE — ED Provider Notes (Signed)
[facility]    CSN: [id number] Arrival date & time: 06/28/19  1441      History   Chief Complaint Chief Complaint  Patient presents with  . Toe Injury    DOI 06/22/19    HPI Karen Dudley is a 48 y.o. female.   48 yo female with a c/o right little toe pain after stubbing her toe about 5 days ago. Also c/o a blister between the 4th and 5th toes and redness around that area and top of the foot. Denies any drainage, fevers, chills.      Past Medical History:  Diagnosis Date  . Breast lump   . Factor V Leiden carrier ([place])   . Thyroid disease     Patient Active Problem List   Diagnosis Date Noted  . Abnormal mammogram 05/04/2018  . Abnormal Pap smear 05/04/2018  . Anxiety and depression 05/04/2018  . Family history of factor V Leiden mutation 05/04/2018  . Fibroadenoma 05/04/2018  . 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 Prediabetes 09/26/2017  . Hypothyroidism 04/18/2015  . Essential hypertension 11/09/2014  . Back pain, chronic 08/12/2013  . Class 2 severe obesity due to excess calories with serious comorbidity and body mass index (BMI) of 36.0 to 36.9 in adult ([place]) 12/30/2012  . Gastroesophageal reflux disease without esophagitis 12/30/2012  . DDD (degenerative disc disease), lumbosacral 07/02/2012  . Thoracic or lumbosacral neuritis or radiculitis 06/29/2012    Past Surgical History:  Procedure Laterality Date  . BACK SURGERY  12/2015   [facility]  . BREAST BIOPSY Right    negative  . BREAST EXCISIONAL BIOPSY Right    negative 2011  . CESAREAN SECTION  2003  . fatty lipoma    . TUBAL LIGATION      OB History    Gravida  5   Para  2   Term      Preterm  1   AB  3   Living  1     SAB  2   TAB      Ectopic  1   Multiple      Live Births               Home Medications    Prior to Admission medications   Medication Sig Start Date End Date Taking? Authorizing Provider  acyclovir (ZOVIRAX) 200 MG  capsule Take by mouth. 09/26/11  Yes [provider]  cetirizine (ZYRTEC) 10 MG tablet Take 10 mg by mouth.   Yes [provider]  cyclobenzaprine (FLEXERIL) 5 MG tablet  11/04/16  Yes [provider]  esomeprazole (NEXIUM) 40 MG capsule Take 40 mg by mouth. 08/07/16  Yes [provider]  gabapentin (NEURONTIN) 300 MG capsule Take 300 mg by mouth. 07/24/16  Yes [provider]  hydrochlorothiazide (MICROZIDE) 12.5 MG capsule  09/27/16  Yes [provider]  ibuprofen (ADVIL,MOTRIN) 800 MG tablet  11/04/16  Yes [provider]  levothyroxine (SYNTHROID, LEVOTHROID) 25 MCG tablet TAKE 1 TABLET ON AN EMPTY STOMACH WITH A GLASS OF WATER AT LEAST 30 TO 60 MINUTES BEFORE BREAKFAST. 02/16/16  Yes [provider]  sertraline (ZOLOFT) 100 MG tablet TAKE (1) TABLET BY MOUTH EVERY DAY 03/20/16  Yes [provider]  traMADol (ULTRAM) 50 MG tablet Take 50 mg by mouth. 07/24/16  Yes [provider]  ALPRAZolam (NIRAVAM) 0.5 MG dissolvable tablet Take 0.5 mg by mouth. 01/05/14   [provider]  cephALEXin (KEFLEX)  500 MG capsule Take 1 capsule (500 mg total) by mouth 3 (three) times daily. 06/28/19   Norval Gable, MD  fluconazole (DIFLUCAN) 150 MG tablet Take 1 tablet (150 mg total) by mouth daily. 06/28/19   Norval Gable, MD    Family History Family History  Problem Relation Age of Onset  . Depression Mother   . Alcohol abuse Father   . Breast cancer Neg Hx     Social History Social History   Tobacco Use  . Smoking status: Never Smoker  . Smokeless tobacco: Never Used  Substance Use Topics  . Alcohol use: Yes    Comment: rare  . Drug use: No     Allergies   Sulfa antibiotics   Review of Systems Review of Systems   Physical Exam Triage Vital Signs ED Triage Vitals  Enc Vitals Group     BP 06/28/19 1510 126/70     Pulse Rate 06/28/19 1510 100     Resp 06/28/19 1510 18     Temp 06/28/19 1510  98.4 F (36.9 C)     Temp Source 06/28/19 1510 Oral     SpO2 06/28/19 1510 99 %     Weight 06/28/19 1510 250 lb (113.4 kg)     Height 06/28/19 1510 5\' 7"  (1.702 m)     Head Circumference --      Peak Flow --      Pain Score 06/28/19 1509 9     Pain Loc --      Pain Edu? --      Excl. in [place]? --    No data found.  Updated Vital Signs BP 126/70 (BP Location: Left Arm)   Pulse 100   Temp 98.4 F (36.9 C) (Oral)   Resp 18   Ht 5\' 7"  (1.702 m)   Wt 113.4 kg   LMP 06/14/2019 (Approximate)   SpO2 99%   BMI 39.16 kg/m   Visual Acuity Right Eye Distance:   Left Eye Distance:   Bilateral Distance:    Right Eye Near:   Left Eye Near:    Bilateral Near:     Physical Exam Vitals and nursing note reviewed.  Constitutional:      General: She is not in acute distress.    Appearance: She is not toxic-appearing or diaphoretic.  Musculoskeletal:     Right foot: Normal range of motion and normal capillary refill. Bony tenderness (5th toe) present. No bunion, Charcot foot, foot drop, prominent metatarsal heads, laceration or crepitus. Normal pulse.     Comments: Small blister between the 4th and 5th toes with mild surrounding skin erythema, warmth and tenderness  Neurological:     Mental Status: She is alert.      UC Treatments / Results  Labs (all labs ordered are listed, but only abnormal results are displayed) Labs Reviewed - No data to display  EKG   Radiology No results found.  Procedures Procedures (including critical care time)  Medications Ordered in UC Medications - No data to display  Initial Impression / Assessment and Plan / UC Course  I have reviewed the triage vital signs and the nursing notes.  Pertinent labs & imaging results that were available during my care of the patient were reviewed by me and considered in my medical decision making (see chart for details).      Final Clinical Impressions(s) / UC Diagnoses   Final diagnoses:  Abscess or  cellulitis of toe, right  Contusion of fifth toe of  right foot, initial encounter     Discharge Instructions     Warm water soaks or compresses; elevate; tylenol/advil as needed    ED Prescriptions    Medication Sig Dispense Auth. Provider   cephALEXin (KEFLEX) 500 MG capsule Take 1 capsule (500 mg total) by mouth 3 (three) times daily. 30 capsule Norval Gable, MD   fluconazole (DIFLUCAN) 150 MG tablet Take 1 tablet (150 mg total) by mouth daily. 1 tablet Norval Gable, MD      1. x-ray results and diagnosis reviewed with patient 2. rx as per orders above; reviewed possible side effects, interactions, risks and benefits  3. Recommend supportive treatment as above 4. Follow-up prn if symptoms worsen or don't improve  PDMP not reviewed this encounter.   Norval Gable, MD 06/30/19 [phone]

## 2019-06-28 NOTE — Discharge Instructions (Signed)
Warm water soaks or compresses; elevate; tylenol/advil as needed

## 2019-06-28 NOTE — ED Triage Notes (Signed)
Patient in today c/o right little toe after hitting her toe on a metal gate on 06/22/19.

## 2020-04-23 IMAGING — CR DG TOE 5TH 2+V*R*
4 series · 4 of 4 positions shown · non-contrast
Comparison: None.

CLINICAL DATA: Right fifth toe pain after hitting it on a metal
gate on 06/22/2019.

EXAM:
RIGHT FIFTH TOE

[toe ap (1 of 2)]
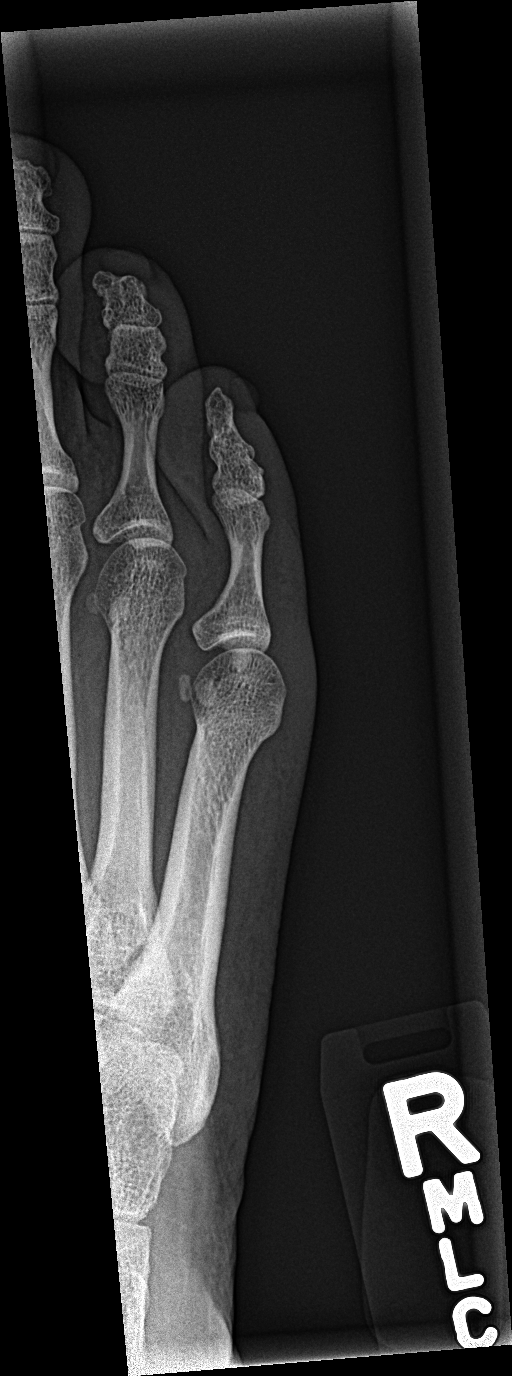

[toe obl]
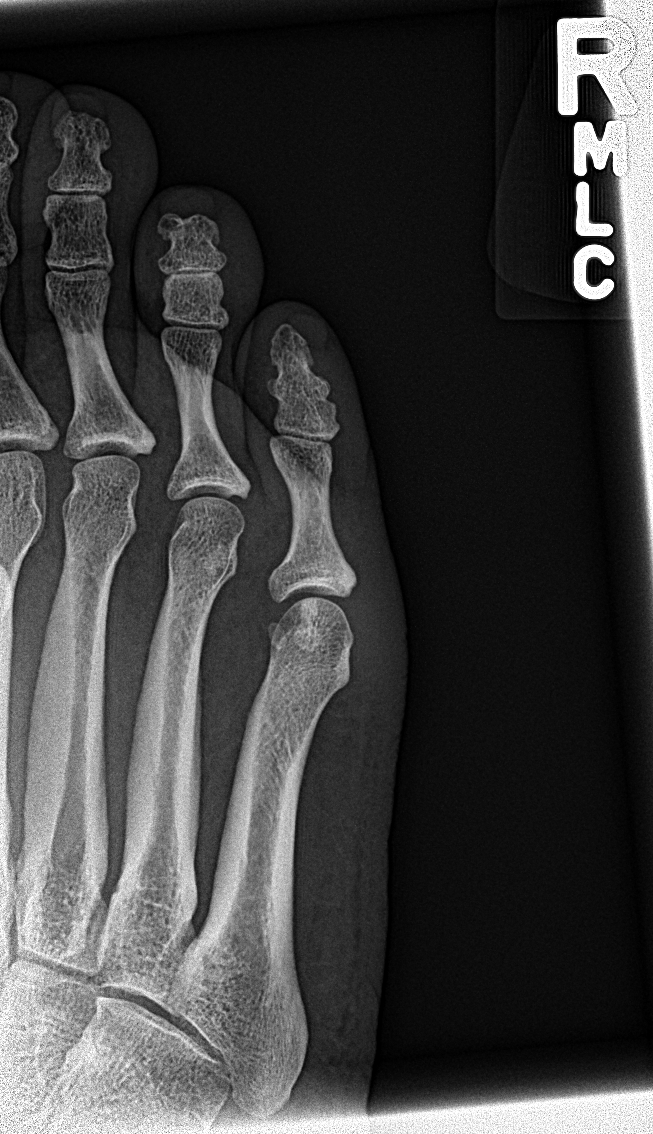

[toe lat]
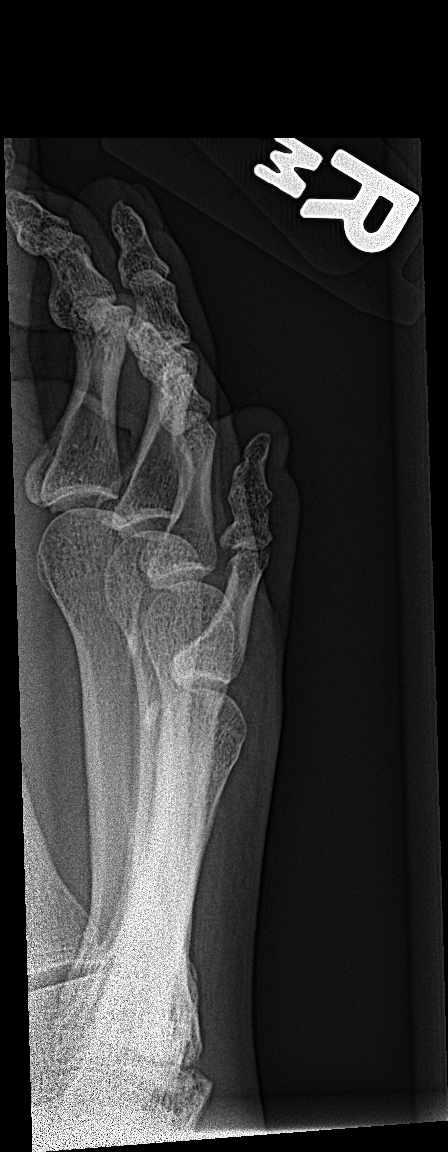

[toe ap (2 of 2)]
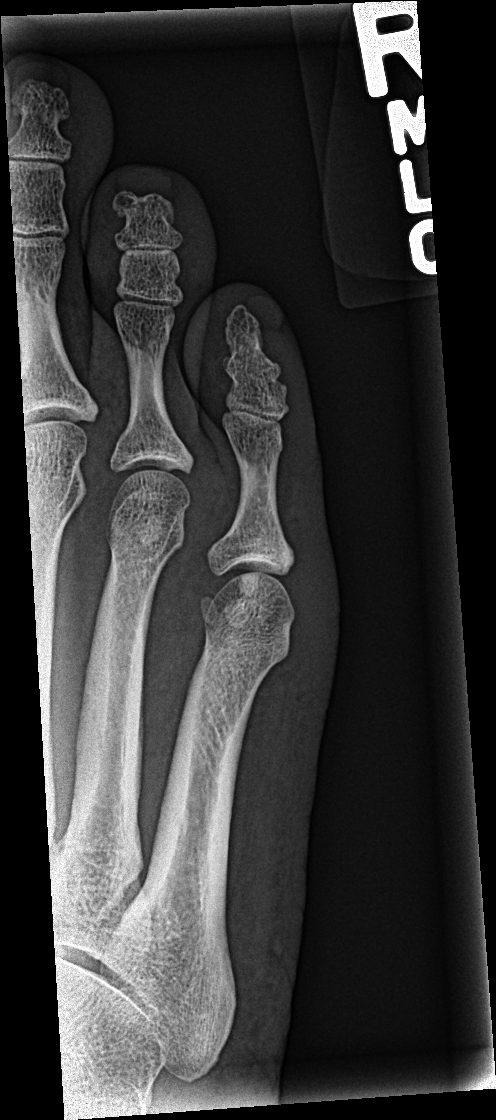

[4 of 4 positions shown; findings below may reference images not displayed]

FINDINGS: No acute fracture or dislocation is identified. Fusion of the middle
and distal phalanges of the fifth toe is likely congenital. There is
mild soft tissue swelling adjacent to the fifth MTP joint.
IMPRESSION: Mild soft tissue swelling without evidence of acute osseous
abnormality.

## 2020-05-31 ENCOUNTER — Other Ambulatory Visit: Payer: Self-pay

## 2020-05-31 ENCOUNTER — Ambulatory Visit: Payer: BLUE CROSS/BLUE SHIELD | Admitting: Advanced Practice Midwife
